# Patient Record
Sex: Male | Born: 1990 | Race: White | Hispanic: No | Marital: Single | Smoking: Current every day smoker
Health system: Southern US, Community
[De-identification: ages and names within clinical notes are randomized; demographics above are authoritative.]

---
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
## 2013-05-20 ENCOUNTER — Encounter (HOSPITAL_COMMUNITY): Payer: Self-pay | Admitting: Emergency Medicine

## 2013-05-20 ENCOUNTER — Emergency Department (HOSPITAL_COMMUNITY)
Admission: EM | Admit: 2013-05-20 | Discharge: 2013-05-21 | Disposition: A | Discharge status: Home / Self Care | Payer: Self-pay | Attending: Emergency Medicine | Admitting: Emergency Medicine

## 2013-05-20 DIAGNOSIS — F10929 Alcohol use, unspecified with intoxication, unspecified: Secondary | ICD-10-CM

## 2013-05-20 DIAGNOSIS — F1410 Cocaine abuse, uncomplicated: Secondary | ICD-10-CM | POA: Insufficient documentation

## 2013-05-20 DIAGNOSIS — F10229 Alcohol dependence with intoxication, unspecified: Secondary | ICD-10-CM | POA: Insufficient documentation

## 2013-05-20 LAB — CBC WITH DIFFERENTIAL/PLATELET
BASOS PCT: 0 % (ref 0–1)
Basophils Absolute: 0 10*3/uL (ref 0.0–0.1)
Eosinophils Absolute: 0.1 10*3/uL (ref 0.0–0.7)
Eosinophils Relative: 1 % (ref 0–5)
HCT: 44.8 % (ref 39.0–52.0)
HEMOGLOBIN: 15.5 g/dL (ref 13.0–17.0)
LYMPHS ABS: 2.4 10*3/uL (ref 0.7–4.0)
LYMPHS PCT: 26 % (ref 12–46)
MCH: 32.4 pg (ref 26.0–34.0)
MCHC: 34.6 g/dL (ref 30.0–36.0)
MCV: 93.7 fL (ref 78.0–100.0)
MONO ABS: 0.7 10*3/uL (ref 0.1–1.0)
Monocytes Relative: 8 % (ref 3–12)
NEUTROS ABS: 5.9 10*3/uL (ref 1.7–7.7)
NEUTROS PCT: 65 % (ref 43–77)
PLATELETS: 194 10*3/uL (ref 150–400)
RBC: 4.78 MIL/uL (ref 4.22–5.81)
RDW: 13 % (ref 11.5–15.5)
WBC: 9 10*3/uL (ref 4.0–10.5)

## 2013-05-20 LAB — COMPREHENSIVE METABOLIC PANEL
ALK PHOS: 59 U/L (ref 39–117)
ALT: 19 U/L (ref 0–53)
AST: 20 U/L (ref 0–37)
Albumin: 4.2 g/dL (ref 3.5–5.2)
BUN: 10 mg/dL (ref 6–23)
CO2: 26 mEq/L (ref 19–32)
Calcium: 9.1 mg/dL (ref 8.4–10.5)
Chloride: 103 mEq/L (ref 96–112)
Creatinine, Ser: 0.8 mg/dL (ref 0.50–1.35)
GFR calc non Af Amer: >90 mL/min (ref >90)
GLUCOSE: 96 mg/dL (ref 70–99)
Potassium: 3.6 mEq/L — ABNORMAL LOW (ref 3.7–5.3)
SODIUM: 143 meq/L (ref 137–147)
Total Bilirubin: 0.5 mg/dL (ref 0.3–1.2)
Total Protein: 7.9 g/dL (ref 6.0–8.3)

## 2013-05-20 LAB — RAPID URINE DRUG SCREEN, HOSP PERFORMED
Amphetamines: NOT DETECTED
BARBITURATES: NOT DETECTED
Benzodiazepines: NOT DETECTED
COCAINE: POSITIVE — AB
OPIATES: NOT DETECTED
TETRAHYDROCANNABINOL: POSITIVE — AB

## 2013-05-20 LAB — ETHANOL: Alcohol, Ethyl (B): 177 mg/dL — ABNORMAL HIGH (ref 0–11)

## 2013-05-20 MED ORDER — POTASSIUM CHLORIDE CRYS ER 20 MEQ PO TBCR: 40.0000 meq | EXTENDED_RELEASE_TABLET | Freq: Once | ORAL | Status: DC
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
## 2013-05-21 NOTE — Discharge Instructions (Signed)
Don't drink alcohol, and don't use cocaine or any other drugs!  Alcohol Intoxication Alcohol intoxication occurs when the amount of alcohol that a person has consumed impairs his or her ability to mentally and physically function. Alcohol directly impairs the normal chemical activity of the brain. Drinking large amounts of alcohol can lead to changes in mental function and behavior, and it can cause many physical effects that can be harmful.  Alcohol intoxication can range in severity from mild to very severe. Various factors can affect the level of intoxication that occurs, such as the person's age, gender, weight, frequency of alcohol consumption, and the presence of other medical conditions (such as diabetes, seizures, or heart conditions). Dangerous levels of alcohol intoxication may occur when people drink large amounts of alcohol in a short period (binge drinking). Alcohol can also be especially dangerous when combined with certain prescription medicines or "recreational" drugs. SIGNS AND SYMPTOMS Some common signs and symptoms of mild alcohol intoxication include:  Loss of coordination.  Changes in mood and behavior.  Impaired judgment.  Slurred speech. As alcohol intoxication progresses to more severe levels, other signs and symptoms will appear. These may include:  Vomiting.  Confusion and impaired memory.  Slowed breathing.  Seizures.  Loss of consciousness. DIAGNOSIS  Your health care provider will take a medical history and perform a physical exam. You will be asked about the amount and type of alcohol you have consumed. Blood tests will be done to measure the concentration of alcohol in your blood. In many places, your blood alcohol level must be lower than 80 mg/dL (1.19%0.08%) to legally drive. However, many dangerous effects of alcohol can occur at much lower levels.  TREATMENT  People with alcohol intoxication often do not require treatment. Most of the effects of alcohol  intoxication are temporary, and they go away as the alcohol naturally leaves the body. Your health care provider will monitor your condition until you are stable enough to go home. Fluids are sometimes given through an IV access tube to help prevent dehydration.  HOME CARE INSTRUCTIONS  Do not drive after drinking alcohol.  Stay hydrated. Drink enough water and fluids to keep your urine clear or pale yellow. Avoid caffeine.   Only take over-the-counter or prescription medicines as directed by your health care provider.  SEEK MEDICAL CARE IF:   You have persistent vomiting.   You do not feel better after a few days.  You have frequent alcohol intoxication. Your health care provider can help determine if you should see a substance use treatment counselor. SEEK IMMEDIATE MEDICAL CARE IF:   You become shaky or tremble when you try to stop drinking.   You shake uncontrollably (seizure).   You throw up (vomit) blood. This may be bright red or may look like black coffee grounds.   You have blood in your stool. This may be bright red or may appear as a black, tarry, bad smelling stool.   You become lightheaded or faint.  MAKE SURE YOU:   Understand these instructions.  Will watch your condition.  Will get help right away if you are not doing well or get worse. Document Released: 11/08/2004 Document Revised: 10/01/2012 Document Reviewed: 07/04/2012 [facility] Patient Information 2014 [employer], [place].

## 2013-05-21 NOTE — ED Provider Notes (Signed)
CSN: [id number]     Arrival date & time 05/20/13  2122 History   First MD Initiated Contact with Patient 05/21/13 0002    This chart was scribed for Walter Booze, MD by Marica Otter, ED Scribe. This patient was seen in room APA15/APA15 and the patient's care was started at 12:03 AM.  PCP: No primary provider on file. LEVEL 5 CAVEAT (Due to Intoxication)  Chief Complaint  Patient presents with  . Medical Clearance    HPI HPI Comments: Walter Chavez is a 23 y.o. male brought in by [facility] to the ED for medical clearance. Has no complaints currently. Please state they just want to make sure that he is medically stable to be placed in jail. Patient is very vague about how much she has had to drink tonight.   History reviewed. No pertinent past medical history. History reviewed. No pertinent past surgical history. History reviewed. No pertinent family history. History  Substance Use Topics  . Smoking status: Unknown If Ever Smoked  . Smokeless tobacco: Not on file  . Alcohol Use: Yes     Comment: PT DENIES ANY ALCOHOL USE. PT SMELLS OF ALCOHOL.    Review of Systems  Unable to perform ROS: Other      Allergies  Review of patient's allergies indicates no known allergies.  Home Medications  No current outpatient prescriptions on file. Triage Vitals: BP 128/65  Pulse 91  Temp(Src) 97.9 F (36.6 C) (Oral)  Resp 24  Ht 5\' 10"  (1.778 m)  Wt 170 lb (77.111 kg)  BMI 24.39 kg/m2  SpO2 96% Physical Exam  Nursing note and vitals reviewed. Constitutional: He is oriented to person, place, and time. He appears well-developed and well-nourished. No distress.  somnolent but arrousable  Speech slightly slurred consistent with alcohol consumption   HENT:  Head: Normocephalic and atraumatic.  Eyes: Pupils are equal, round, and reactive to light.  Neck: Normal range of motion. Neck supple. No JVD present. No tracheal deviation present.  Cardiovascular: Normal rate.   No murmur  heard. Pulmonary/Chest: Effort normal. No respiratory distress.  Abdominal: Soft. Bowel sounds are normal. He exhibits no mass. There is no tenderness.  Musculoskeletal: Normal range of motion. He exhibits no edema and no tenderness.  Lymphadenopathy:    He has no cervical adenopathy.  Neurological: He is alert and oriented to person, place, and time. No cranial nerve deficit. Coordination normal.  Skin: Skin is warm and dry. No rash noted.    ED Course  Procedures (including critical care time) DIAGNOSTIC STUDIES: Oxygen Saturation is 96% on RA, adequate by my interpretation.    COORDINATION OF CARE:    Labs Review Results for orders placed during the hospital encounter of 05/20/13  CBC WITH DIFFERENTIAL      Result Value Ref Range   WBC 9.0  4.0 - 10.5 K/uL   RBC 4.78  4.22 - 5.81 MIL/uL   Hemoglobin 15.5  13.0 - 17.0 g/dL   HCT 14.7  82.9 - 56.2 %   MCV 93.7  78.0 - 100.0 fL   MCH 32.4  26.0 - 34.0 pg   MCHC 34.6  30.0 - 36.0 g/dL   RDW 13.0  86.5 - 78.4 %   Platelets 194  150 - 400 K/uL   Neutrophils Relative % 65  43 - 77 %   Neutro Abs 5.9  1.7 - 7.7 K/uL   Lymphocytes Relative 26  12 - 46 %   Lymphs Abs 2.4  0.7 -  4.0 K/uL   Monocytes Relative 8  3 - 12 %   Monocytes Absolute 0.7  0.1 - 1.0 K/uL   Eosinophils Relative 1  0 - 5 %   Eosinophils Absolute 0.1  0.0 - 0.7 K/uL   Basophils Relative 0  0 - 1 %   Basophils Absolute 0.0  0.0 - 0.1 K/uL  COMPREHENSIVE METABOLIC PANEL      Result Value Ref Range   Sodium 143  137 - 147 mEq/L   Potassium 3.6 (*) 3.7 - 5.3 mEq/L   Chloride 103  96 - 112 mEq/L   CO2 26  19 - 32 mEq/L   Glucose, Bld 96  70 - 99 mg/dL   BUN 10  6 - 23 mg/dL   Creatinine, Ser 1.470.80  0.50 - 1.35 mg/dL   Calcium 9.1  8.4 - 82.910.5 mg/dL   Total Protein 7.9  6.0 - 8.3 g/dL   Albumin 4.2  3.5 - 5.2 g/dL   AST 20  0 - 37 U/L   ALT 19  0 - 53 U/L   Alkaline Phosphatase 59  39 - 117 U/L   Total Bilirubin 0.5  0.3 - 1.2 mg/dL   GFR calc non Af Amer  >90  >90 mL/min   GFR calc Af Amer >90  >90 mL/min  ETHANOL      Result Value Ref Range   Alcohol, Ethyl (B) 177 (*) 0 - 11 mg/dL  URINE RAPID DRUG SCREEN (HOSP PERFORMED)      Result Value Ref Range   Opiates NONE DETECTED  NONE DETECTED   Cocaine POSITIVE (*) NONE DETECTED   Benzodiazepines NONE DETECTED  NONE DETECTED   Amphetamines NONE DETECTED  NONE DETECTED   Tetrahydrocannabinol POSITIVE (*) NONE DETECTED   Barbiturates NONE DETECTED  NONE DETECTED    MDM   Final diagnoses:  Alcohol intoxication  Cocaine abuse    Alcohol intoxication. At this time I do not see any other medical issues. Alcohol level is, elevated consistent with alcohol intoxication. Drug screen is also positive for cocaine. He is released in police custody to return to jail.  I personally performed the services described in this documentation, which was scribed in my presence. The recorded information has been reviewed and is accurate.     Walter Boozeavid Kassidee Narciso, MD 05/22/13 [phone]

## 2019-01-16 ENCOUNTER — Emergency Department (HOSPITAL_COMMUNITY)
Admission: EM | Admit: 2019-01-16 | Discharge: 2019-01-16 | Disposition: A | Discharge status: Home / Self Care | Payer: Medicaid - Out of State | Attending: Emergency Medicine | Admitting: Emergency Medicine

## 2019-01-16 ENCOUNTER — Other Ambulatory Visit: Payer: Self-pay

## 2019-01-16 ENCOUNTER — Encounter (HOSPITAL_COMMUNITY): Payer: Self-pay

## 2019-01-16 DIAGNOSIS — K0889 Other specified disorders of teeth and supporting structures: Secondary | ICD-10-CM | POA: Insufficient documentation

## 2019-01-16 DIAGNOSIS — F17200 Nicotine dependence, unspecified, uncomplicated: Secondary | ICD-10-CM | POA: Diagnosis not present

## 2019-01-16 MED ORDER — OXYCODONE-ACETAMINOPHEN 5-325 MG PO TABS
1.0000 | ORAL_TABLET | Freq: Once | ORAL | Status: AC
  Administered 2019-01-16: 1 via ORAL
  Filled 2019-01-16: qty 1

## 2019-01-16 MED ORDER — IBUPROFEN 800 MG PO TABS: 800.0000 mg | ORAL_TABLET | Freq: Three times a day (TID) | ORAL | 0 refills | Status: AC

## 2019-01-16 MED ORDER — PENICILLIN V POTASSIUM 250 MG PO TABS
500.0000 mg | ORAL_TABLET | Freq: Once | ORAL | Status: AC
  Administered 2019-01-16: 04:00:00 500 mg via ORAL
  Filled 2019-01-16: qty 2

## 2019-01-16 MED ORDER — PENICILLIN V POTASSIUM 500 MG PO TABS: 500.0000 mg | ORAL_TABLET | Freq: Four times a day (QID) | ORAL | 0 refills | Status: AC

## 2019-01-16 MED ORDER — KETOROLAC TROMETHAMINE 60 MG/2ML IM SOLN
60.0000 mg | Freq: Once | INTRAMUSCULAR | Status: AC
  Administered 2019-01-16: 60 mg via INTRAMUSCULAR
  Filled 2019-01-16: qty 2

## 2019-01-16 NOTE — ED Provider Notes (Signed)
[facility] EMERGENCY DEPARTMENT Provider Note   CSN: [id number] Arrival date & time: 01/16/19  7893     History   Chief Complaint Chief Complaint  Patient presents with  . Dental Pain    HPI Walter Chavez is a 28 y.o. male.      Dental Pain Location:  Upper Upper teeth location:  15/LU 2nd molar Quality:  Aching, sharp and pulsating Severity:  Mild Onset quality:  Gradual Duration:  3 days Timing:  Constant Progression:  Worsening Chronicity:  New Context: dental fracture   Previous work-up:  Dental exam Ineffective treatments:  NSAIDs Associated symptoms: no congestion     History reviewed. No pertinent past medical history.  There are no active problems to display for this patient.   History reviewed. No pertinent surgical history.      Home Medications    Prior to Admission medications   Medication Sig Start Date End Date Taking? Authorizing Provider  ibuprofen (ADVIL) 800 MG tablet Take 1 tablet (800 mg total) by mouth 3 (three) times daily. 01/16/19   Sarvesh Meddaugh, Barbara Cower, MD  penicillin v potassium (VEETID) 500 MG tablet Take 1 tablet (500 mg total) by mouth 4 (four) times daily for 10 days. 01/16/19 01/26/19  Yafet Cline, Barbara Cower, MD    Family History No family history on file.  Social History Social History   Tobacco Use  . Smoking status: Current Every Day Smoker  . Smokeless tobacco: Never Used  Substance Use Topics  . Alcohol use: Never    Frequency: Never    Comment: PT DENIES ANY ALCOHOL USE. PT SMELLS OF ALCOHOL.  . Drug use: No    Comment: UNKNOWN     Allergies   Patient has no known allergies.   Review of Systems Review of Systems  HENT: Negative for congestion.   All other systems reviewed and are negative.    Physical Exam Updated Vital Signs BP (!) 152/114 (BP Location: Right Arm)   Pulse 77   Temp 97.8 F (36.6 C) (Oral)   Resp 18   Ht 5\' 10"  (1.778 m)   Wt 88.5 kg   SpO2 99%   BMI 27.98 kg/m   Physical Exam Vitals  signs and nursing note reviewed.  Constitutional:      Appearance: He is well-developed.  HENT:     Head: Normocephalic and atraumatic.     Mouth/Throat:     Mouth: Mucous membranes are dry.     Pharynx: Oropharynx is clear.      Comments: Fractured tooth with mild gum edema, no fluctuance or significant erythema. Eyes:     Conjunctiva/sclera: Conjunctivae normal.  Neck:     Musculoskeletal: Normal range of motion.  Cardiovascular:     Rate and Rhythm: Normal rate.  Pulmonary:     Effort: Pulmonary effort is normal. No respiratory distress.  Abdominal:     General: There is no distension.  Musculoskeletal: Normal range of motion.  Skin:    General: Skin is warm and dry.     Coloration: Skin is not jaundiced or pale.  Neurological:     General: No focal deficit present.     Mental Status: He is alert.      ED Treatments / Results  Labs (all labs ordered are listed, but only abnormal results are displayed) Labs Reviewed - No data to display  EKG None  Radiology No results found.  Procedures Procedures (including critical care time)  Medications Ordered in ED Medications  ketorolac (TORADOL)  injection 60 mg (60 mg Intramuscular Given 01/16/19 0353)  oxyCODONE-acetaminophen (PERCOCET/ROXICET) 5-325 MG per tablet 1 tablet (1 tablet Oral Given 01/16/19 0353)  penicillin v potassium (VEETID) tablet 500 mg (500 mg Oral Given 01/16/19 0353)     Initial Impression / Assessment and Plan / ED Course  I have reviewed the triage vital signs and the nursing notes.  Pertinent labs & imaging results that were available during my care of the patient were reviewed by me and considered in my medical decision making (see chart for details).        Dental pain with likely infection, no e/o AOM or deep space neck infection. Resources provided.   Final Clinical Impressions(s) / ED Diagnoses   Final diagnoses:  Pain, dental    ED Discharge Orders         Ordered     penicillin v potassium (VEETID) 500 MG tablet  4 times daily     01/16/19 0348    ibuprofen (ADVIL) 800 MG tablet  3 times daily     01/16/19 0348           Danzell Birky, Corene Cornea, MD 01/16/19 [phone]

## 2019-01-16 NOTE — ED Triage Notes (Signed)
Pt c/o pain to left bottom tooth x several days.
